# Patient Record
Sex: Female | Born: 1975 | Race: Black or African American | Hispanic: No | Marital: Single | Smoking: Never smoker
Health system: Southern US, Community
[De-identification: ages and names within clinical notes are randomized; demographics above are authoritative.]

## PROBLEM LIST (undated history)

## (undated) DIAGNOSIS — G43909 Migraine, unspecified, not intractable, without status migrainosus: Secondary | ICD-10-CM

## (undated) DIAGNOSIS — N809 Endometriosis, unspecified: Secondary | ICD-10-CM

## (undated) DIAGNOSIS — I10 Essential (primary) hypertension: Secondary | ICD-10-CM

## (undated) DIAGNOSIS — E119 Type 2 diabetes mellitus without complications: Secondary | ICD-10-CM

## (undated) DIAGNOSIS — J45909 Unspecified asthma, uncomplicated: Secondary | ICD-10-CM

## (undated) HISTORY — PX: CHOLECYSTECTOMY: SHX55

## (undated) HISTORY — PX: GASTRIC BYPASS: SHX52

## (undated) HISTORY — PX: APPENDECTOMY: SHX54

## (undated) HISTORY — PX: TONSILLECTOMY: SUR1361

---

## 2017-01-20 ENCOUNTER — Emergency Department (HOSPITAL_BASED_OUTPATIENT_CLINIC_OR_DEPARTMENT_OTHER)
Admission: EM | Admit: 2017-01-20 | Discharge: 2017-01-20 | Disposition: A | Discharge status: Home / Self Care | Payer: BLUE CROSS/BLUE SHIELD | Attending: Emergency Medicine | Admitting: Emergency Medicine

## 2017-01-20 ENCOUNTER — Encounter (HOSPITAL_BASED_OUTPATIENT_CLINIC_OR_DEPARTMENT_OTHER): Payer: Self-pay | Admitting: *Deleted

## 2017-01-20 DIAGNOSIS — B9789 Other viral agents as the cause of diseases classified elsewhere: Secondary | ICD-10-CM

## 2017-01-20 DIAGNOSIS — J45909 Unspecified asthma, uncomplicated: Secondary | ICD-10-CM | POA: Diagnosis not present

## 2017-01-20 DIAGNOSIS — J069 Acute upper respiratory infection, unspecified: Secondary | ICD-10-CM | POA: Diagnosis not present

## 2017-01-20 DIAGNOSIS — R05 Cough: Secondary | ICD-10-CM | POA: Diagnosis present

## 2017-01-20 DIAGNOSIS — I10 Essential (primary) hypertension: Secondary | ICD-10-CM | POA: Insufficient documentation

## 2017-01-20 DIAGNOSIS — Z79899 Other long term (current) drug therapy: Secondary | ICD-10-CM | POA: Insufficient documentation

## 2017-01-20 DIAGNOSIS — F17210 Nicotine dependence, cigarettes, uncomplicated: Secondary | ICD-10-CM | POA: Insufficient documentation

## 2017-01-20 DIAGNOSIS — E119 Type 2 diabetes mellitus without complications: Secondary | ICD-10-CM | POA: Insufficient documentation

## 2017-01-20 HISTORY — DX: Essential (primary) hypertension: I10

## 2017-01-20 HISTORY — DX: Endometriosis, unspecified: N80.9

## 2017-01-20 HISTORY — DX: Migraine, unspecified, not intractable, without status migrainosus: G43.909

## 2017-01-20 HISTORY — DX: Type 2 diabetes mellitus without complications: E11.9

## 2017-01-20 HISTORY — DX: Unspecified asthma, uncomplicated: J45.909

## 2017-01-20 MED ORDER — NAPROXEN 250 MG PO TABS
500.0000 mg | ORAL_TABLET | Freq: Once | ORAL | Status: AC
Start: 2017-01-20 — End: 2017-01-20
  Administered 2017-01-20: 500 mg via ORAL
  Filled 2017-01-20: qty 2

## 2017-01-20 MED ORDER — ALBUTEROL SULFATE HFA 108 (90 BASE) MCG/ACT IN AERS
2.0000 | INHALATION_SPRAY | RESPIRATORY_TRACT | Status: DC | PRN
  Administered 2017-01-20: 2 via RESPIRATORY_TRACT
  Filled 2017-01-20: qty 6.7

## 2017-01-20 NOTE — ED Provider Notes (Signed)
[facility] Provider Note: Yvonne DellJ. Lane Maleya Leever, MD, FACEP  CSN: [id number] MRN: [id number] ARRIVAL: 01/20/17 at 0210 ROOM: [facility]01/[facility]01   CHIEF COMPLAINT  Cough   HISTORY OF PRESENT ILLNESS  Yvonne Burns is a 41 y.o. female with UR's eye symptoms the past 3 days. Specifically she has had nasal congestion, scratchy throat, body aches (which she rates as a 10 out of 10), cough productive of yellow sputum, and chest wall soreness with cough. She has not been wheezing. She denies fever, nausea, vomiting or diarrhea. She has been taking over-the-counter antihistamines and TheraFlu with partial relief.   Past Medical History:  Diagnosis Date  . Asthma   . Diabetes mellitus without complication (HCC)   . Endometriosis   . Hypertension   . Migraine     Past Surgical History:  Procedure Laterality Date  . APPENDECTOMY    . CHOLECYSTECTOMY    . GASTRIC BYPASS    . TONSILLECTOMY      No family history on file.  Social History  Substance Use Topics  . Smoking status: Smoker, Current Status Unknown    Types: Cigarettes  . Smokeless tobacco: Never Used  . Alcohol use Yes    Prior to Admission medications   Medication Sig Start Date End Date Taking? Authorizing Provider  amLODipine (NORVASC) 10 MG tablet Take 10 mg by mouth daily.   Yes Historical Provider, MD  cyclobenzaprine (FLEXERIL) 10 MG tablet Take 10 mg by mouth 2 (two) times daily.   Yes Historical Provider, MD  lisinopril (PRINIVIL,ZESTRIL) 20 MG tablet Take 20 mg by mouth daily.   Yes Historical Provider, MD  omeprazole (PRILOSEC) 20 MG capsule Take 20 mg by mouth daily.   Yes Historical Provider, MD  sertraline (ZOLOFT) 100 MG tablet Take 100 mg by mouth daily.   Yes Historical Provider, MD    Allergies Sulfur   REVIEW OF SYSTEMS  Negative except as noted here or in the History of Present Illness.   PHYSICAL EXAMINATION  Initial Vital Signs Blood pressure (!) 161/112, pulse 89, temperature 98.3 F  (36.8 C), temperature source Oral, resp. rate 18, height 5\' 2"  (1.575 m), weight 179 lb (81.2 kg), SpO2 98 %.  Examination General: Well-developed, well-nourished female in no acute distress; appearance consistent with age of record HENT: normocephalic; atraumatic; no nasal congestion; no pharyngeal erythema or exudate Eyes: pupils equal, round and reactive to light; extraocular muscles intact Neck: supple Heart: regular rate and rhythm Lungs: clear to auscultation bilaterally; persistent dry cough Abdomen: soft; nondistended; nontender; bowel sounds present Extremities: No deformity; full range of motion; pulses normal Neurologic: Awake, alert and oriented; motor function intact in all extremities and symmetric; no facial droop Skin: Warm and dry Psychiatric: Normal mood and affect   RESULTS  Summary of this visit's results, reviewed by myself:   EKG Interpretation  Date/Time:    Ventricular Rate:    PR Interval:    QRS Duration:   QT Interval:    QTC Calculation:   R Axis:     Text Interpretation:        Laboratory Studies: No results found for this or any previous visit (from the past 24 hour(s)). Imaging Studies: No results found.  ED COURSE  Nursing notes and initial vitals signs, including pulse oximetry, reviewed.  Vitals:   01/20/17 0219 01/20/17 0221  BP: (!) 161/112   Pulse: 89   Resp: 18   Temp: 98.3 F (36.8 C)   TempSrc: Oral   SpO2:  98%   Weight:  179 lb (81.2 kg)  Height:  5\' 2"  (1.575 m)    PROCEDURES    ED DIAGNOSES     ICD-9-CM ICD-10-CM   1. Viral URI with cough 465.9 J06.9     B97.89        Paula Libra, MD 01/20/17 [phone]

## 2017-01-20 NOTE — ED Triage Notes (Signed)
c/o cough, congestion, runny nose, body aches onset monday

## 2017-02-05 ENCOUNTER — Emergency Department (HOSPITAL_BASED_OUTPATIENT_CLINIC_OR_DEPARTMENT_OTHER): Payer: BLUE CROSS/BLUE SHIELD

## 2017-02-05 ENCOUNTER — Encounter (HOSPITAL_BASED_OUTPATIENT_CLINIC_OR_DEPARTMENT_OTHER): Payer: Self-pay | Admitting: Emergency Medicine

## 2017-02-05 DIAGNOSIS — I10 Essential (primary) hypertension: Secondary | ICD-10-CM | POA: Insufficient documentation

## 2017-02-05 DIAGNOSIS — Z7984 Long term (current) use of oral hypoglycemic drugs: Secondary | ICD-10-CM | POA: Diagnosis not present

## 2017-02-05 DIAGNOSIS — J45909 Unspecified asthma, uncomplicated: Secondary | ICD-10-CM | POA: Insufficient documentation

## 2017-02-05 DIAGNOSIS — R51 Headache: Secondary | ICD-10-CM | POA: Diagnosis present

## 2017-02-05 DIAGNOSIS — Y929 Unspecified place or not applicable: Secondary | ICD-10-CM | POA: Insufficient documentation

## 2017-02-05 DIAGNOSIS — X509XXA Other and unspecified overexertion or strenuous movements or postures, initial encounter: Secondary | ICD-10-CM | POA: Diagnosis not present

## 2017-02-05 DIAGNOSIS — G43709 Chronic migraine without aura, not intractable, without status migrainosus: Secondary | ICD-10-CM | POA: Diagnosis not present

## 2017-02-05 DIAGNOSIS — M25512 Pain in left shoulder: Secondary | ICD-10-CM | POA: Insufficient documentation

## 2017-02-05 DIAGNOSIS — Y999 Unspecified external cause status: Secondary | ICD-10-CM | POA: Diagnosis not present

## 2017-02-05 DIAGNOSIS — Y939 Activity, unspecified: Secondary | ICD-10-CM | POA: Insufficient documentation

## 2017-02-05 DIAGNOSIS — E119 Type 2 diabetes mellitus without complications: Secondary | ICD-10-CM | POA: Insufficient documentation

## 2017-02-05 DIAGNOSIS — F17210 Nicotine dependence, cigarettes, uncomplicated: Secondary | ICD-10-CM | POA: Insufficient documentation

## 2017-02-05 NOTE — ED Triage Notes (Signed)
Patient states that she has been changing her shifts recently and it has caused her to have recurring Migraines. Patient denies any N?v  - patient is text ing on her phone during triage without grimacing or noted worsening of Headache. Patient reports that light makes her headache worse. Patient also reports that her and her friend were "messing" around the other day and since she has had left shoulder pain with movement

## 2017-02-06 ENCOUNTER — Emergency Department (HOSPITAL_BASED_OUTPATIENT_CLINIC_OR_DEPARTMENT_OTHER)
Admission: EM | Admit: 2017-02-06 | Discharge: 2017-02-06 | Disposition: A | Discharge status: Home / Self Care | Payer: BLUE CROSS/BLUE SHIELD | Attending: Emergency Medicine | Admitting: Emergency Medicine

## 2017-02-06 DIAGNOSIS — G43009 Migraine without aura, not intractable, without status migrainosus: Secondary | ICD-10-CM

## 2017-02-06 DIAGNOSIS — M25512 Pain in left shoulder: Secondary | ICD-10-CM

## 2017-02-06 MED ORDER — IBUPROFEN 800 MG PO TABS: 800.0000 mg | ORAL_TABLET | Freq: Three times a day (TID) | ORAL | 0 refills | Status: DC | PRN

## 2017-02-06 MED ORDER — METOCLOPRAMIDE HCL 5 MG/ML IJ SOLN
10.0000 mg | Freq: Once | INTRAMUSCULAR | Status: AC
  Administered 2017-02-06: 10 mg via INTRAVENOUS
  Filled 2017-02-06: qty 2

## 2017-02-06 MED ORDER — HYDROCODONE-ACETAMINOPHEN 5-325 MG PO TABS: 1.0000 | ORAL_TABLET | Freq: Four times a day (QID) | ORAL | 0 refills | Status: AC | PRN

## 2017-02-06 MED ORDER — KETOROLAC TROMETHAMINE 30 MG/ML IJ SOLN
30.0000 mg | Freq: Once | INTRAMUSCULAR | Status: AC
  Administered 2017-02-06: 30 mg via INTRAVENOUS
  Filled 2017-02-06: qty 1

## 2017-02-06 MED ORDER — SODIUM CHLORIDE 0.9 % IV BOLUS (SEPSIS)
1000.0000 mL | Freq: Once | INTRAVENOUS | Status: AC
  Administered 2017-02-06: 1000 mL via INTRAVENOUS

## 2017-02-06 MED ORDER — DIPHENHYDRAMINE HCL 50 MG/ML IJ SOLN
25.0000 mg | Freq: Once | INTRAMUSCULAR | Status: AC
  Administered 2017-02-06: 25 mg via INTRAVENOUS
  Filled 2017-02-06: qty 1

## 2017-02-06 NOTE — ED Provider Notes (Signed)
TIME SEEN: 1:04 AM  CHIEF COMPLAINT: Migraine headache, left shoulder pain  HPI: Patient is a 41 year old female with history of hypertension, diabetes, asthma, migraine headaches who presents to the emergency department with 2 complaints. She reports that she has had a diffuse throbbing headache that started at 5 PM yesterday consistent with her migraine headaches. Has associated photophobia. No nausea or vomiting. No head injury. Not on blood thinners. No numbness, tingling or focal weakness. States she has had to come to the hospital before she gets a "pain shot" but cannot remember the name of this medication.  Patient also complaining of left shoulder pain. Reports she was "horsing around" the other day and is now having pain with lifting the shoulder above the head. She is left-hand dominant. No numbness or tingling in his arm. No neck or back pain. No head injury.  ROS: See HPI Constitutional: no fever  Eyes: no drainage  ENT: no runny nose   Cardiovascular:  no chest pain  Resp: no SOB  GI: no vomiting GU: no dysuria Integumentary: no rash  Allergy: no hives  Musculoskeletal: no leg swelling  Neurological: no slurred speech ROS otherwise negative  PAST MEDICAL HISTORY/PAST SURGICAL HISTORY:  Past Medical History:  Diagnosis Date  . Asthma   . Diabetes mellitus without complication (HCC)   . Endometriosis   . Hypertension   . Migraine     MEDICATIONS:  Prior to Admission medications   Medication Sig Start Date End Date Taking? Authorizing Provider  amLODipine (NORVASC) 10 MG tablet Take 10 mg by mouth daily.    [provider]  cyclobenzaprine (FLEXERIL) 10 MG tablet Take 10 mg by mouth 2 (two) times daily.    [provider]  lisinopril (PRINIVIL,ZESTRIL) 20 MG tablet Take 20 mg by mouth daily.    [provider]  omeprazole (PRILOSEC) 20 MG capsule Take 20 mg by mouth daily.    [provider]  sertraline (ZOLOFT) 100 MG tablet Take  100 mg by mouth daily.    [provider]    ALLERGIES:  Allergies  Allergen Reactions  . Sulfur     SOCIAL HISTORY:  Social History  Substance Use Topics  . Smoking status: Smoker, Current Status Unknown    Types: Cigarettes  . Smokeless tobacco: Never Used  . Alcohol use Yes    FAMILY HISTORY: History reviewed. No pertinent family history.  EXAM: BP (!) 143/92 (BP Location: Right Arm)   Pulse 88   Temp 98.7 F (37.1 C) (Oral)   Resp 19   Ht 5\' 2"  (1.575 m)   Wt 278 lb (126.1 kg)   LMP 02/01/2017   SpO2 98%   BMI 50.85 kg/m  CONSTITUTIONAL: Alert and oriented and responds appropriately to questions. Well-appearing; well-nourished HEAD: Normocephalic, Atraumatic EYES: Conjunctivae clear, pupils appear equal, EOMI, positive photophobia ENT: normal nose; moist mucous membranes NECK: Supple, no meningismus, no nuchal rigidity, no LAD, no midline spinal tenderness or step-off or deformity  CARD: RRR; S1 and S2 appreciated; no murmurs, no clicks, no rubs, no gallops RESP: Normal chest excursion without splinting or tachypnea; breath sounds clear and equal bilaterally; no wheezes, no rhonchi, no rales, no hypoxia or respiratory distress, speaking full sentences ABD/GI: Normal bowel sounds; non-distended; soft, non-tender, no rebound, no guarding, no peritoneal signs, no hepatosplenomegaly BACK:  The back appears normal and is non-tender to palpation, there is no CVA tenderness EXT: Patient is tender to palpation throughout the left shoulder without loss of fullness  of this joint. Unable to lift the arm above her head without pain. 2+ radial pulses bilaterally. Compartments are all soft. No bony deformity on exam. Otherwise Normal ROM in all joints; otherwise extremities are non-tender to palpation; no edema; normal capillary refill; no cyanosis, no calf tenderness or swelling    SKIN: Normal color for age and race; warm; no rash NEURO: Moves all extremities equally,  sensation to light touch intact diffusely, normal speech, cranial nerves II through XII intact, normal gait PSYCH: The patient's mood and manner are appropriate. Grooming and personal hygiene are appropriate.  MEDICAL DECISION MAKING: Patient here with her typical migraine headache. Will treat with migraine cocktail including portal, Reglan, Benadryl and IV fluids. We'll hold on Decadron given she is diabetic. I do not think she has meningitis, intracranial hemorrhage or stroke. I do not feel she needs emergent head imaging.  She also complaining of left shoulder pain. Suspect strain/sprain versus tendinitis. I feel the Toradol will help with her pain. X-ray obtained in triage shows no acute abnormality. No sign of septic arthritis on exam. Compartments are soft. Neurovascularly intact distally. No obvious sign of trauma on exam. No neck pain.  ED PROGRESS: Patient reports headache is almost completely resolved after migraine cocktail and shorter pain has improved as well. Will get outpatient orthopedic follow-up information and discharge with brief prescription of pain medication to take as needed. Discussed return precautions. Patient's family member at bedside will drive her home.   At this time, I do not feel there is any life-threatening condition present. I have reviewed and discussed all results (EKG, imaging, lab, urine as appropriate) and exam findings with patient/family. I have reviewed nursing notes and appropriate previous records.  I feel the patient is safe to be discharged home without further emergent workup and can continue workup as an outpatient as needed. Discussed usual and customary return precautions. Patient/family verbalize understanding and are comfortable with this plan.  Outpatient follow-up has been provided if needed. All questions have been answered.      Abbigail Anstey, Layla MawKristen N, DO 02/06/17 [phone]

## 2017-02-14 ENCOUNTER — Emergency Department (HOSPITAL_BASED_OUTPATIENT_CLINIC_OR_DEPARTMENT_OTHER)
Admission: EM | Admit: 2017-02-14 | Discharge: 2017-02-15 | Disposition: A | Discharge status: Home / Self Care | Payer: BLUE CROSS/BLUE SHIELD | Attending: Emergency Medicine | Admitting: Emergency Medicine

## 2017-02-14 ENCOUNTER — Encounter (HOSPITAL_BASED_OUTPATIENT_CLINIC_OR_DEPARTMENT_OTHER): Payer: Self-pay

## 2017-02-14 DIAGNOSIS — M25512 Pain in left shoulder: Secondary | ICD-10-CM | POA: Diagnosis not present

## 2017-02-14 DIAGNOSIS — R51 Headache: Secondary | ICD-10-CM | POA: Insufficient documentation

## 2017-02-14 DIAGNOSIS — H53149 Visual discomfort, unspecified: Secondary | ICD-10-CM | POA: Diagnosis not present

## 2017-02-14 DIAGNOSIS — E119 Type 2 diabetes mellitus without complications: Secondary | ICD-10-CM | POA: Diagnosis not present

## 2017-02-14 DIAGNOSIS — I10 Essential (primary) hypertension: Secondary | ICD-10-CM | POA: Insufficient documentation

## 2017-02-14 DIAGNOSIS — R519 Headache, unspecified: Secondary | ICD-10-CM

## 2017-02-14 DIAGNOSIS — Z79899 Other long term (current) drug therapy: Secondary | ICD-10-CM | POA: Diagnosis not present

## 2017-02-14 DIAGNOSIS — J45909 Unspecified asthma, uncomplicated: Secondary | ICD-10-CM | POA: Diagnosis not present

## 2017-02-14 MED ORDER — DIPHENHYDRAMINE HCL 50 MG/ML IJ SOLN
25.0000 mg | Freq: Once | INTRAMUSCULAR | Status: AC
  Administered 2017-02-14: 25 mg via INTRAVENOUS
  Filled 2017-02-14: qty 1

## 2017-02-14 MED ORDER — METOCLOPRAMIDE HCL 5 MG/ML IJ SOLN
10.0000 mg | Freq: Once | INTRAMUSCULAR | Status: AC
  Administered 2017-02-14: 10 mg via INTRAVENOUS
  Filled 2017-02-14: qty 2

## 2017-02-14 MED ORDER — KETOROLAC TROMETHAMINE 30 MG/ML IJ SOLN
30.0000 mg | Freq: Once | INTRAMUSCULAR | Status: AC
  Administered 2017-02-14: 30 mg via INTRAVENOUS
  Filled 2017-02-14: qty 1

## 2017-02-14 MED ORDER — SODIUM CHLORIDE 0.9 % IV BOLUS (SEPSIS)
1000.0000 mL | Freq: Once | INTRAVENOUS | Status: AC
  Administered 2017-02-14: 1000 mL via INTRAVENOUS

## 2017-02-14 NOTE — ED Triage Notes (Signed)
C/o migraine x today-also c/o pain to left shoulder x 1 week-NAD-steady gait

## 2017-02-14 NOTE — ED Notes (Signed)
Pt reports migraine, pain around left side of head and behind left eye that began at 7pm. States history of migraines, but did not attempt to take any meds PTA to relieve pain. Associated light sensitivity. Denies vomiting or diarrhea. Pt also c/o ongoing left shoulder pain x2 weeks, states seen and evaluated for this, had a negative xray and told it was a sprain.

## 2017-02-14 NOTE — ED Provider Notes (Signed)
[facility]   CSN: [id number] Arrival date & time: 02/14/17  2115  By signing my name below, I, Ny'Kea Lewis, attest that this documentation has been prepared under the direction and in the presence of Audry Pili, PA-C. Electronically Signed: Karren Cobble, ED Scribe. 02/14/17. 11:49 PM.  History   Chief Complaint Chief Complaint  Patient presents with  . Migraine    Migraine  Associated symptoms include headaches.   HPI Comments: Yvonne Burns is a 41 y.o. female with a PMHx of HTN, DM, asthma, and migraine headaches who presents to the Emergency Department complaining of gradual onset of a migraine at 7 pm tonight consistent with her migraine headaches and ongoing left shoulder pain. Rates pain 8/10. Aching sensation. She notes associated left-sided eye pain, phonophobia, photophobia, and blurred vision. No fevers. Pt was seen on 5/20 with the same complaints.   Patient also complaining of persistent left shoulder pain that started two weeks ago after she was splaying around with her daughter. During her visit on 5/20 she was told that her Xray was unremarkable and she possibly had a sprain. Pt has not since followed up with orthopedics. Denies numbness,or tingling in his arm. No neck or back pain. No head injury.     Past Medical History:  Diagnosis Date  . Asthma   . Diabetes mellitus without complication (HCC)   . Endometriosis   . Hypertension   . Migraine     There are no active problems to display for this patient.  Past Surgical History:  Procedure Laterality Date  . APPENDECTOMY    . CHOLECYSTECTOMY    . GASTRIC BYPASS    . TONSILLECTOMY     OB History    No data available      Home Medications    Prior to Admission medications   Medication Sig Start Date End Date Taking? Authorizing Provider  amLODipine (NORVASC) 10 MG tablet Take 10 mg by mouth daily.    [provider]  cyclobenzaprine (FLEXERIL) 10 MG tablet Take 10  mg by mouth 2 (two) times daily.    [provider]  HYDROcodone-acetaminophen (NORCO/VICODIN) 5-325 MG tablet Take 1-2 tablets by mouth every 6 (six) hours as needed. 02/06/17   Ward, Layla Maw, DO  ibuprofen (ADVIL,MOTRIN) 800 MG tablet Take 1 tablet (800 mg total) by mouth every 8 (eight) hours as needed for mild pain. 02/06/17   Ward, Layla Maw, DO  lisinopril (PRINIVIL,ZESTRIL) 20 MG tablet Take 20 mg by mouth daily.    [provider]  omeprazole (PRILOSEC) 20 MG capsule Take 20 mg by mouth daily.    [provider]  sertraline (ZOLOFT) 100 MG tablet Take 100 mg by mouth daily.    [provider]    Family History No family history on file.  Social History Social History  Substance Use Topics  . Smoking status: Never Smoker  . Smokeless tobacco: Never Used  . Alcohol use Yes     Comment: occ     Allergies   Sulfur   Review of Systems Review of Systems  HENT:       Notes phonophobia.   Eyes: Positive for photophobia, pain and visual disturbance.  Musculoskeletal: Positive for arthralgias.  Neurological: Positive for headaches. Negative for numbness.       Denies tingling.    A complete 10 system review of systems was obtained and all systems are negative except as noted in the HPI and PMH.  Physical Exam Updated Vital Signs BP 123/70 (BP Location: Right Arm)   Pulse 96   Temp 99 F (37.2 C) (Oral)   Resp 17   Ht 5\' 2"  (1.575 m)   Wt 281 lb (127.5 kg)   LMP 02/01/2017   SpO2 97%   BMI 51.40 kg/m   Physical Exam  Constitutional: She is oriented to person, place, and time. Vital signs are normal. She appears well-developed and well-nourished.  HENT:  Head: Normocephalic and atraumatic.  Right Ear: Hearing normal.  Left Ear: Hearing normal.  Eyes: Conjunctivae and EOM are normal. Pupils are equal, round, and reactive to light.  Neck: Normal range of motion. Neck supple.  Cardiovascular: Normal rate, regular rhythm, normal  heart sounds and intact distal pulses.   Pulmonary/Chest: Effort normal and breath sounds normal.  Abdominal: Soft.  Musculoskeletal: Normal range of motion.  Left Shoulder: Decrease ROM due to pain. NVI. Distal pulses appreciated. Pain with internal/external rotation. No swelling. No erythema.   Neurological: She is alert and oriented to person, place, and time. She has normal strength. No cranial nerve deficit or sensory deficit.  Cranial Nerves:  II: Pupils equal, round, reactive to light III,IV, VI: ptosis not present, extra-ocular motions intact bilaterally  V,VII: smile symmetric, facial light touch sensation equal VIII: hearing grossly normal bilaterally  IX,X: midline uvula rise  XI: bilateral shoulder shrug equal and strong XII: midline tongue extension  Skin: Skin is warm and dry.  Psychiatric: She has a normal mood and affect. Her speech is normal and behavior is normal. Thought content normal.  Nursing note and vitals reviewed.   ED Treatments / Results  DIAGNOSTIC STUDIES: Oxygen Saturation is 97% on RA, normal by my interpretation.   COORDINATION OF CARE: 11:00 PM-Discussed next steps with pt. Pt verbalized understanding and is agreeable with the plan.   Labs (all labs ordered are listed, but only abnormal results are displayed) Labs Reviewed - No data to display  EKG  EKG Interpretation None      Radiology No results found.  Procedures Procedures (including critical care time)  Medications Ordered in ED Medications  oxyCODONE-acetaminophen (PERCOCET/ROXICET) 5-325 MG per tablet 1 tablet (not administered)  metoCLOPramide (REGLAN) injection 10 mg (10 mg Intravenous Given 02/14/17 2319)  diphenhydrAMINE (BENADRYL) injection 25 mg (25 mg Intravenous Given 02/14/17 2319)  ketorolac (TORADOL) 30 MG/ML injection 30 mg (30 mg Intravenous Given 02/14/17 2319)  sodium chloride 0.9 % bolus 1,000 mL (1,000 mLs Intravenous New Bag/Given 02/14/17 2318)   Initial  Impression / Assessment and Plan / ED Course  I have reviewed the triage vital signs and the nursing notes.  Pertinent labs & imaging results that were available during my care of the patient were reviewed by me and considered in my medical decision making (see chart for details).  Final Clinical Impressions(s) / ED Diagnoses  I have reviewed and evaluated the relevant imaging studies.  I have reviewed the relevant previous healthcare records. I obtained HPI from historian. \ ED Course:  Assessment: Patient is a 41 y.o. female that presents with headache x today. Hx same. No fevers. Seen on 02-06-17 for same. Patient is without high-risk features of headache including: Sudden onset/thunderclap HA, No similar headache in past, Altered mental status, Accompanying seizure, Headache with exertion, Age > 50, History of immunocompromise, Neck or shoulder pain, Fever, Use of anticoagulation, Family history of spontaneous SAH, Concomitant drug use, Toxic exposure.  Patient has a normal complete neurological exam, normal vital signs, normal  level of consciousness, no signs of meningismus, is well-appearing/non-toxic appearing, no signs of trauma. No papilledema, no pain over the temporal arteries. Imaging with CT/MRI not indicated given history and physical exam findings. No dangerous or life-threatening conditions suspected or identified by history, physical exam, and by work-up. No indications for hospitalization identified. Pt also with left shoulder pain. Noted previous on ED visit on 02-06-17. Unremarkable imaging. Pt has yet to follow up with Orthopedics. No new trauma. Headache resolved with migraine cocktail. At time of discharge, Patient is in no acute distress. Vital Signs are stable. Patient is able to ambulate. Patient able to tolerate PO.   Disposition/Plan:  DC Home Additional Verbal discharge instructions given and discussed with patient.  Pt Instructed to f/u with PCP in the next week for  evaluation and treatment of symptoms. Return precautions given Pt acknowledges and agrees with plan  Supervising Physician Rolan BuccoBelfi, Melanie, MD   Final diagnoses:  Nonintractable headache, unspecified chronicity pattern, unspecified headache type  Acute pain of left shoulder    New Prescriptions New Prescriptions   No medications on file   I personally performed the services described in this documentation, which was scribed in my presence. The recorded information has been reviewed and is accurate.    Audry PiliMohr, Alia Parsley, PA-C 02/15/17 0019    Rolan BuccoBelfi, Melanie, MD 02/15/17 1504

## 2017-02-15 MED ORDER — IBUPROFEN 600 MG PO TABS: 600.0000 mg | ORAL_TABLET | Freq: Four times a day (QID) | ORAL | 0 refills | Status: DC | PRN

## 2017-02-15 MED ORDER — OXYCODONE-ACETAMINOPHEN 5-325 MG PO TABS
1.0000 | ORAL_TABLET | Freq: Once | ORAL | Status: AC
  Administered 2017-02-15: 1 via ORAL
  Filled 2017-02-15: qty 1

## 2017-02-15 NOTE — Discharge Instructions (Signed)
Please read and follow all provided instructions.  Your diagnoses today include:  1. Nonintractable headache, unspecified chronicity pattern, unspecified headache type   2. Acute pain of left shoulder     Tests performed today include: CT of your head which was normal and did not show any serious cause of your headache Vital signs. See below for your results today.   Medications:  In the Emergency Department you received: Reglan - antinausea/headache medication Benadryl - antihistamine to counteract potential side effects of reglan Toradol - NSAID medication similar to ibuprofen  Take any prescribed medications only as directed.  Additional information:  Follow any educational materials contained in this packet.  You are having a headache. No specific cause was found today for your headache. It may have been a migraine or other cause of headache. Stress, anxiety, fatigue, and depression are common triggers for headaches.   Your headache today does not appear to be life-threatening or require hospitalization, but often the exact cause of headaches is not determined in the emergency department. Therefore, follow-up with your doctor is very important to find out what may have caused your headache and whether or not you need any further diagnostic testing or treatment.   Sometimes headaches can appear benign (not harmful), but then more serious symptoms can develop which should prompt an immediate re-evaluation by your doctor or the emergency department.  BE VERY CAREFUL not to take multiple medicines containing Tylenol (also called acetaminophen). Doing so can lead to an overdose which can damage your liver and cause liver failure and possibly death.   Follow-up instructions: Please follow-up with your primary care provider in the next 3 days for further evaluation of your symptoms.   Return instructions:  Please return to the Emergency Department if you experience worsening  symptoms. Return if the medications do not resolve your headache, if it recurs, or if you have multiple episodes of vomiting or cannot keep down fluids. Return if you have a change from the usual headache. RETURN IMMEDIATELY IF you: Develop a sudden, severe headache Develop confusion or become poorly responsive or faint Develop a fever above 100.51F or problem breathing Have a change in speech, vision, swallowing, or understanding Develop new weakness, numbness, tingling, incoordination in your arms or legs Have a seizure Please return if you have any other emergent concerns.  Additional Information:  Your vital signs today were: BP 120/81    Pulse 72    Temp 99 F (37.2 C) (Oral)    Resp 20    Ht 5\' 2"  (1.575 m)    Wt 127.5 kg (281 lb)    LMP 02/01/2017    SpO2 95%    BMI 51.40 kg/m  If your blood pressure (BP) was elevated above 135/85 this visit, please have this repeated by your doctor within one month. --------------

## 2017-06-25 ENCOUNTER — Encounter (HOSPITAL_BASED_OUTPATIENT_CLINIC_OR_DEPARTMENT_OTHER): Payer: Self-pay | Admitting: Emergency Medicine

## 2017-06-25 ENCOUNTER — Emergency Department (HOSPITAL_BASED_OUTPATIENT_CLINIC_OR_DEPARTMENT_OTHER)
Admission: EM | Admit: 2017-06-25 | Discharge: 2017-06-25 | Disposition: A | Discharge status: Home / Self Care | Payer: BLUE CROSS/BLUE SHIELD | Attending: Emergency Medicine | Admitting: Emergency Medicine

## 2017-06-25 DIAGNOSIS — Z202 Contact with and (suspected) exposure to infections with a predominantly sexual mode of transmission: Secondary | ICD-10-CM | POA: Insufficient documentation

## 2017-06-25 DIAGNOSIS — I10 Essential (primary) hypertension: Secondary | ICD-10-CM | POA: Insufficient documentation

## 2017-06-25 DIAGNOSIS — Z79899 Other long term (current) drug therapy: Secondary | ICD-10-CM | POA: Insufficient documentation

## 2017-06-25 DIAGNOSIS — Z112 Encounter for screening for other bacterial diseases: Secondary | ICD-10-CM | POA: Insufficient documentation

## 2017-06-25 DIAGNOSIS — E119 Type 2 diabetes mellitus without complications: Secondary | ICD-10-CM | POA: Insufficient documentation

## 2017-06-25 DIAGNOSIS — J45909 Unspecified asthma, uncomplicated: Secondary | ICD-10-CM | POA: Insufficient documentation

## 2017-06-25 LAB — WET PREP, GENITAL
Clue Cells Wet Prep HPF POC: NONE SEEN
SPERM: NONE SEEN
Trich, Wet Prep: NONE SEEN
Yeast Wet Prep HPF POC: NONE SEEN

## 2017-06-25 LAB — URINALYSIS, MICROSCOPIC (REFLEX): RBC / HPF: NONE SEEN RBC/hpf (ref 0–5)

## 2017-06-25 LAB — URINALYSIS, ROUTINE W REFLEX MICROSCOPIC
BILIRUBIN URINE: NEGATIVE
GLUCOSE, UA: NEGATIVE mg/dL
HGB URINE DIPSTICK: NEGATIVE
Ketones, ur: NEGATIVE mg/dL
LEUKOCYTES UA: NEGATIVE
Nitrite: POSITIVE — AB
PH: 6 (ref 5.0–8.0)
Protein, ur: NEGATIVE mg/dL
Specific Gravity, Urine: >1.03 — ABNORMAL HIGH (ref 1.005–1.030)

## 2017-06-25 MED ORDER — CEFTRIAXONE SODIUM 250 MG IJ SOLR
250.0000 mg | Freq: Once | INTRAMUSCULAR | Status: AC
  Administered 2017-06-25: 250 mg via INTRAMUSCULAR
  Filled 2017-06-25: qty 250

## 2017-06-25 MED ORDER — AZITHROMYCIN 250 MG PO TABS
1000.0000 mg | ORAL_TABLET | Freq: Once | ORAL | Status: AC
  Administered 2017-06-25: 1000 mg via ORAL
  Filled 2017-06-25: qty 4

## 2017-06-25 NOTE — ED Triage Notes (Signed)
"   My girlfriend tested positive for trich and so I need to be tested" Also having left shoulder pain x 1 month

## 2017-06-25 NOTE — ED Provider Notes (Signed)
[facility] Provider Note   CSN: [id number] Arrival date & time: 06/25/17  1201     History   Chief Complaint Chief Complaint  Patient presents with  . Exposure to STD    HPI Yvonne Burns is a 41 y.o. female who presents to the emergency department with a chief complaint of STD exposure. Her female sexual partner was recently diagnosed with Trichomonas. She denies dysuria, frequency, vaginal pain, itching, or discharge, fever, chills, or pelvic pain at this time. No treatment prior to arrival.   She reports that she is currently sexually active with only one female partner at this time. She was seen by her PCP earlier this week and diagnosed with a UTI, but has not started antibiotics at this time.  The history is provided by the patient. No language interpreter was used.    Past Medical History:  Diagnosis Date  . Asthma   . Diabetes mellitus without complication (HCC)   . Endometriosis   . Hypertension   . Migraine     There are no active problems to display for this patient.   Past Surgical History:  Procedure Laterality Date  . APPENDECTOMY    . CHOLECYSTECTOMY    . GASTRIC BYPASS    . TONSILLECTOMY      OB History    No data available       Home Medications    Prior to Admission medications   Medication Sig Start Date End Date Taking? Authorizing Provider  amLODipine (NORVASC) 10 MG tablet Take 10 mg by mouth daily.   Yes [provider]  cyclobenzaprine (FLEXERIL) 10 MG tablet Take 10 mg by mouth 2 (two) times daily.   Yes [provider]  FLUoxetine (PROZAC) 10 MG tablet Take 10 mg by mouth daily.   Yes [provider]  lisinopril (PRINIVIL,ZESTRIL) 20 MG tablet Take 20 mg by mouth daily.   Yes [provider]  omeprazole (PRILOSEC) 20 MG capsule Take 20 mg by mouth daily.   Yes [provider]  sertraline (ZOLOFT) 100 MG tablet Take 100 mg by mouth daily.   Yes [provider]    HYDROcodone-acetaminophen (NORCO/VICODIN) 5-325 MG tablet Take 1-2 tablets by mouth every 6 (six) hours as needed. 02/06/17   Ward, Layla Maw, DO  ibuprofen (ADVIL,MOTRIN) 600 MG tablet Take 1 tablet (600 mg total) by mouth every 6 (six) hours as needed. 02/15/17   Audry Pili, PA-C    Family History No family history on file.  Social History Social History  Substance Use Topics  . Smoking status: Never Smoker  . Smokeless tobacco: Never Used  . Alcohol use Yes     Comment: occ     Allergies   Sulfur   Review of Systems Review of Systems  Constitutional: Negative for chills and fever.  Genitourinary: Negative for dysuria, frequency, genital sores, pelvic pain, urgency, vaginal bleeding, vaginal discharge and vaginal pain.     Physical Exam Updated Vital Signs BP 138/90 (BP Location: Left Arm)   Pulse 64   Temp 98.4 F (36.9 C) (Oral)   Resp 18   Ht  (1.575 m)   Wt (!) 174.2 kg (384 lb)   LMP 06/07/2017   SpO2 98%   BMI 70.23 kg/m   Physical Exam  Constitutional: No distress.  Obese female.  HENT:  Head: Normocephalic.  Eyes: Conjunctivae are normal.  Neck: Neck supple.  Cardiovascular: Normal rate, regular rhythm, normal heart sounds and intact distal pulses.  Exam reveals no gallop and no friction rub.   No murmur heard. Pulmonary/Chest: Effort normal and breath sounds normal. No respiratory distress. She has no wheezes. She has no rales. She exhibits no tenderness.  Abdominal: Soft. Bowel sounds are normal. She exhibits no distension and no mass. There is no tenderness. There is no rebound and no guarding. No hernia.  No CVA tenderness bilaterally.  Genitourinary:  Genitourinary Comments: Chaperoned exam. No cervical motion tenderness or adnexal tenderness. No discharge noted in the vaginal vault.  Neurological: She is alert.  Skin: Skin is warm. No rash noted.  Psychiatric: Her behavior is normal.  Nursing note and vitals reviewed.    ED  Treatments / Results  Labs (all labs ordered are listed, but only abnormal results are displayed) Labs Reviewed  WET PREP, GENITAL - Abnormal; Notable for the following:       Result Value   WBC, Wet Prep HPF POC FEW (*)    All other components within normal limits  URINALYSIS, ROUTINE W REFLEX MICROSCOPIC - Abnormal; Notable for the following:    APPearance CLOUDY (*)    Specific Gravity, Urine >1.030 (*)    Nitrite POSITIVE (*)    All other components within normal limits  URINALYSIS, MICROSCOPIC (REFLEX) - Abnormal; Notable for the following:    Bacteria, UA MANY (*)    Squamous Epithelial / LPF 6-30 (*)    All other components within normal limits  URINE CULTURE  GC/CHLAMYDIA PROBE AMP ([place]) NOT AT [facility]    EKG  EKG Interpretation None       Radiology No results found.  Procedures Procedures (including critical care time)  Medications Ordered in ED Medications  azithromycin (ZITHROMAX) tablet 1,000 mg (not administered)  cefTRIAXone (ROCEPHIN) injection 250 mg (not administered)     Initial Impression / Assessment and Plan / ED Course  I have reviewed the triage vital signs and the nursing notes.  Pertinent labs & imaging results that were available during my care of the patient were reviewed by me and considered in my medical decision making (see chart for details).     Patient to be discharged with instructions to follow up with OBGYN. Discussed importance of using protection when sexually active. Pt understands that they have GC/Chlamydia cultures pending and that they will need to inform all sexual partners if results return positive. Pt has been treated prophylacticly with azithromycin and rocephin due to pts history, pelvic exam, and wet prep with increased WBCs. Pt not concerning for PID because hemodynamically stable and no cervical motion tenderness on pelvic exam.   Final Clinical Impressions(s) / ED Diagnoses   Final diagnoses:  Exposure to  STD    New Prescriptions New Prescriptions   No medications on file     Barkley Boards, PA-C 06/25/17 1357    LongArlyss Repress, MD 06/26/17 [phone]

## 2017-06-25 NOTE — Discharge Instructions (Signed)
Your gonorrhea, chlamydia, and urine culture are pending. If you receive a call that you are positive for gonorrhea or chlamydia, you have already been treated.   Please make sure not to have sex, including oral or anal sex, if your partner is currently being treated for an STI. Please wait until one week after antibiotics are finished.   If you develop new or worsening symptoms, including fever or severe pelvic pain, please return to the emergency department for re-evaluation.

## 2017-06-27 LAB — GC/CHLAMYDIA PROBE AMP (~~LOC~~) NOT AT ARMC
Chlamydia: NEGATIVE
NEISSERIA GONORRHEA: NEGATIVE

## 2017-06-28 LAB — URINE CULTURE

## 2017-06-29 ENCOUNTER — Telehealth: Payer: Self-pay | Admitting: Emergency Medicine

## 2017-06-29 NOTE — Telephone Encounter (Signed)
Post ED Visit - Positive Culture Follow-up  Culture report reviewed by antimicrobial stewardship pharmacist:   Enzo Bi, Pharm.D.  Celedonio Miyamoto, Pharm.D., BCPS AQ-ID  Garvin Fila, Pharm.D., BCPS  Georgina Pillion, Pharm.D., BCPS  [place], [place].D., BCPS, AAHIVP  Estella Husk, Pharm.D., BCPS, AAHIVP  Lysle Pearl, PharmD, BCPS  Casilda Carls, PharmD, BCPS  Pollyann Samples, PharmD, BCPS  Positive urine culture Treated with none, asymptomatic, no further patient follow-up is required at this time.  Berle Mull 06/29/2017, 4:24 PM

## 2017-08-14 ENCOUNTER — Encounter (HOSPITAL_BASED_OUTPATIENT_CLINIC_OR_DEPARTMENT_OTHER): Payer: Self-pay | Admitting: Emergency Medicine

## 2017-08-14 ENCOUNTER — Emergency Department (HOSPITAL_BASED_OUTPATIENT_CLINIC_OR_DEPARTMENT_OTHER)
Admission: EM | Admit: 2017-08-14 | Discharge: 2017-08-14 | Disposition: A | Discharge status: Home / Self Care | Payer: Self-pay | Attending: Emergency Medicine | Admitting: Emergency Medicine

## 2017-08-14 ENCOUNTER — Emergency Department (HOSPITAL_BASED_OUTPATIENT_CLINIC_OR_DEPARTMENT_OTHER): Payer: Self-pay

## 2017-08-14 ENCOUNTER — Other Ambulatory Visit: Payer: Self-pay

## 2017-08-14 DIAGNOSIS — J45909 Unspecified asthma, uncomplicated: Secondary | ICD-10-CM | POA: Insufficient documentation

## 2017-08-14 DIAGNOSIS — I10 Essential (primary) hypertension: Secondary | ICD-10-CM | POA: Insufficient documentation

## 2017-08-14 DIAGNOSIS — J111 Influenza due to unidentified influenza virus with other respiratory manifestations: Secondary | ICD-10-CM

## 2017-08-14 DIAGNOSIS — R69 Illness, unspecified: Secondary | ICD-10-CM

## 2017-08-14 DIAGNOSIS — E119 Type 2 diabetes mellitus without complications: Secondary | ICD-10-CM | POA: Insufficient documentation

## 2017-08-14 DIAGNOSIS — M791 Myalgia, unspecified site: Secondary | ICD-10-CM | POA: Insufficient documentation

## 2017-08-14 DIAGNOSIS — G43809 Other migraine, not intractable, without status migrainosus: Secondary | ICD-10-CM | POA: Insufficient documentation

## 2017-08-14 LAB — RAPID STREP SCREEN (MED CTR MEBANE ONLY): STREPTOCOCCUS, GROUP A SCREEN (DIRECT): NEGATIVE

## 2017-08-14 MED ORDER — METOCLOPRAMIDE HCL 10 MG PO TABS
10.0000 mg | ORAL_TABLET | Freq: Once | ORAL | Status: AC
  Administered 2017-08-14: 10 mg via ORAL
  Filled 2017-08-14: qty 1

## 2017-08-14 MED ORDER — BENZONATATE 100 MG PO CAPS: 100.0000 mg | ORAL_CAPSULE | Freq: Three times a day (TID) | ORAL | 0 refills | Status: AC

## 2017-08-14 MED ORDER — KETOROLAC TROMETHAMINE 30 MG/ML IJ SOLN
30.0000 mg | Freq: Once | INTRAMUSCULAR | Status: AC
  Administered 2017-08-14: 30 mg via INTRAMUSCULAR
  Filled 2017-08-14: qty 1

## 2017-08-14 MED ORDER — NAPROXEN 500 MG PO TABS: 500.0000 mg | ORAL_TABLET | Freq: Two times a day (BID) | ORAL | 0 refills | Status: AC

## 2017-08-14 NOTE — ED Provider Notes (Signed)
Emergency Department Provider Note   I have reviewed the triage vital signs and the nursing notes.   HISTORY  Chief Complaint Generalized Body Aches   HPI Yvonne Burns is a 41 y.o. female with PMH of asthma, DM, and HTN presents to the emergency department for evaluation of sore throat, body aches, headache over the past 2-3 days.  She denies any vomiting or diarrhea.  No pain in the chest.  She reports that she did get the flu shot this year.  She has had multiple sick contacts at work with similar symptoms.  No radiation of symptoms.  No modifying factors.  She also notes headache starting today that is typical of her migraine headaches.  No weakness or numbness.  No vision changes.  She does have light sensitivity.   Past Medical History:  Diagnosis Date  . Asthma   . Diabetes mellitus without complication (HCC)   . Endometriosis   . Hypertension   . Migraine     There are no active problems to display for this patient.   Past Surgical History:  Procedure Laterality Date  . APPENDECTOMY    . CHOLECYSTECTOMY    . GASTRIC BYPASS    . TONSILLECTOMY      Current Outpatient Rx  . Order #: [id number] Class: Historical Med  . Order #: [id number] Class: Print  . Order #: [id number] Class: Historical Med  . Order #: [id number] Class: Historical Med  . Order #: [id number] Class: Print  . Order #: [id number] Class: Print  . Order #: [id number] Class: Historical Med  . Order #: [id number] Class: Print  . Order #: [id number] Class: Historical Med  . Order #: [id number] Class: Historical Med    Allergies Sulfur  History reviewed. No pertinent family history.  Social History Social History   Tobacco Use  . Smoking status: Never Smoker  . Smokeless tobacco: Never Used  Substance Use Topics  . Alcohol use: Yes    Comment: occ  . Drug use: No    Review of Systems  Constitutional: Positive subjective fever/chills. Positive bony aches.  Eyes: No visual changes. ENT: Positive  sore throat. Cardiovascular: Denies chest pain. Respiratory: Denies shortness of breath. Gastrointestinal: No abdominal pain.  No nausea, no vomiting.  No diarrhea.  No constipation. Genitourinary: Negative for dysuria. Musculoskeletal: Negative for back pain. Skin: Negative for rash. Neurological: Negative for focal weakness or numbness. Positive HA.   10-point ROS otherwise negative.  ____________________________________________   PHYSICAL EXAM:  VITAL SIGNS: ED Triage Vitals  Enc Vitals Group     BP 08/14/17 1435 (!) 164/105     Pulse Rate 08/14/17 1435 73     Resp 08/14/17 1435 19     Temp 08/14/17 1435 98.3 F (36.8 C)     Temp Source 08/14/17 1435 Oral     SpO2 08/14/17 1435 100 %     Weight 08/14/17 1435 (!) 347 lb (157.4 kg)     Height 08/14/17 1435 5\' 2"  (1.575 m)   Constitutional: Alert and oriented. Well appearing and in no acute distress. Eyes: Conjunctivae are normal. Head: Atraumatic. Nose: No congestion/rhinnorhea. Mouth/Throat: Mucous membranes are moist.  Oropharynx with mild erythema. No exudate. No PTA.  Neck: No stridor.   Cardiovascular: Normal rate, regular rhythm. Good peripheral circulation. Grossly normal heart sounds.   Respiratory: Normal respiratory effort.  No retractions. Lungs CTAB. Gastrointestinal: Soft and nontender. No distention.  Musculoskeletal: No lower extremity tenderness nor edema. No gross deformities of extremities. Neurologic:  Normal speech and language. No gross focal neurologic  deficits are appreciated.  Skin:  Skin is warm, dry and intact. No rash noted.  ____________________________________________   LABS (all labs ordered are listed, but only abnormal results are displayed)  Labs Reviewed  [facility])  CULTURE, GROUP A STREP [facility]   ____________________________________________  RADIOLOGY  Dg Chest 2 View  Result Date: 08/14/2017 CLINICAL DATA:  Patient with cough, fever and sore throat.  EXAM: CHEST  2 VIEW COMPARISON:  CTA chest 11/11/2010; chest radiograph 05/02/2010 FINDINGS: Stable cardiac and mediastinal contours. No consolidative pulmonary opacities. No pleural effusion or pneumothorax. Thoracic spine degenerative changes. IMPRESSION: No acute cardiopulmonary process. Electronically Signed   By: Annia Beltrew  Davis M.D.   On: 08/14/2017 15:35    ____________________________________________   PROCEDURES  Procedure(s) performed:   Procedures  None ____________________________________________   INITIAL IMPRESSION / ASSESSMENT AND PLAN / ED COURSE  Pertinent labs & imaging results that were available during my care of the patient were reviewed by me and considered in my medical decision making (see chart for details).  Patient presents emergency department for evaluation of a viral symptoms including sore throat, body aches, headache.  There is mild erythema in the posterior pharynx without exudate.  She has shotty lymphadenopathy.  Patient out of window to benefit from Tamiflu.  Plan for chest x-ray to rule out pneumonia.  Sending rapid strep testing and will give Toradol and Reglan for headache symptoms.  03:45 PM Rapid strep negative. CXR with no infiltrate. Plan for discharge home with plan for supportive care.   At this time, I do not feel there is any life-threatening condition present. I have reviewed and discussed all results (EKG, imaging, lab, urine as appropriate), exam findings with patient. I have reviewed nursing notes and appropriate previous records.  I feel the patient is safe to be discharged home without further emergent workup. Discussed usual and customary return precautions. Patient and family (if present) verbalize understanding and are comfortable with this plan.  Patient will follow-up with their primary care provider. If they do not have a primary care provider, information for follow-up has been provided to them. All questions have been  answered.  ____________________________________________  FINAL CLINICAL IMPRESSION(S) / ED DIAGNOSES  Final diagnoses:  Influenza-like illness  Other migraine without status migrainosus, not intractable     MEDICATIONS GIVEN DURING THIS VISIT:  Medications  ketorolac (TORADOL) 30 MG/ML injection 30 mg (30 mg Intramuscular Given 08/14/17 1545)  metoCLOPramide (REGLAN) tablet 10 mg (10 mg Oral Given 08/14/17 1545)    Note:  This document was prepared using Dragon voice recognition software and may include unintentional dictation errors.  Alona BeneJoshua Chapel Silverthorn, MD Emergency Medicine    Oluwafemi Villella, Arlyss RepressJoshua G, MD 08/15/17 0000

## 2017-08-14 NOTE — ED Notes (Signed)
Pt not in room.

## 2017-08-14 NOTE — Discharge Instructions (Signed)

## 2017-08-14 NOTE — ED Triage Notes (Signed)
Patient states that she has had sore throat, body aches and headache for that past 2 -3 days. She is hoarse. The patient is texting on phone in triage and in no noted distress

## 2017-08-17 LAB — CULTURE, GROUP A STREP (THRC)

## 2017-09-17 ENCOUNTER — Other Ambulatory Visit: Payer: Self-pay

## 2017-09-17 ENCOUNTER — Encounter (HOSPITAL_BASED_OUTPATIENT_CLINIC_OR_DEPARTMENT_OTHER): Payer: Self-pay | Admitting: Emergency Medicine

## 2017-09-17 ENCOUNTER — Emergency Department (HOSPITAL_BASED_OUTPATIENT_CLINIC_OR_DEPARTMENT_OTHER)
Admission: EM | Admit: 2017-09-17 | Discharge: 2017-09-17 | Disposition: A | Discharge status: Home / Self Care | Payer: Self-pay | Attending: Emergency Medicine | Admitting: Emergency Medicine

## 2017-09-17 ENCOUNTER — Emergency Department (HOSPITAL_BASED_OUTPATIENT_CLINIC_OR_DEPARTMENT_OTHER): Payer: Self-pay

## 2017-09-17 DIAGNOSIS — G43909 Migraine, unspecified, not intractable, without status migrainosus: Secondary | ICD-10-CM | POA: Insufficient documentation

## 2017-09-17 DIAGNOSIS — M25512 Pain in left shoulder: Secondary | ICD-10-CM | POA: Insufficient documentation

## 2017-09-17 DIAGNOSIS — R0789 Other chest pain: Secondary | ICD-10-CM | POA: Insufficient documentation

## 2017-09-17 DIAGNOSIS — Z79899 Other long term (current) drug therapy: Secondary | ICD-10-CM | POA: Insufficient documentation

## 2017-09-17 DIAGNOSIS — G8929 Other chronic pain: Secondary | ICD-10-CM

## 2017-09-17 DIAGNOSIS — R072 Precordial pain: Secondary | ICD-10-CM

## 2017-09-17 DIAGNOSIS — E119 Type 2 diabetes mellitus without complications: Secondary | ICD-10-CM | POA: Insufficient documentation

## 2017-09-17 DIAGNOSIS — I10 Essential (primary) hypertension: Secondary | ICD-10-CM | POA: Insufficient documentation

## 2017-09-17 DIAGNOSIS — G43809 Other migraine, not intractable, without status migrainosus: Secondary | ICD-10-CM

## 2017-09-17 LAB — BASIC METABOLIC PANEL
ANION GAP: 7 (ref 5–15)
BUN: 16 mg/dL (ref 6–20)
CALCIUM: 9.1 mg/dL (ref 8.9–10.3)
CO2: 27 mmol/L (ref 22–32)
CREATININE: 0.57 mg/dL (ref 0.44–1.00)
Chloride: 105 mmol/L (ref 101–111)
GLUCOSE: 115 mg/dL — AB (ref 65–99)
Potassium: 4 mmol/L (ref 3.5–5.1)
Sodium: 139 mmol/L (ref 135–145)

## 2017-09-17 LAB — CBC
HCT: 41.6 % (ref 36.0–46.0)
HEMOGLOBIN: 13.7 g/dL (ref 12.0–15.0)
MCH: 29.8 pg (ref 26.0–34.0)
MCHC: 32.9 g/dL (ref 30.0–36.0)
MCV: 90.6 fL (ref 78.0–100.0)
PLATELETS: 406 10*3/uL — AB (ref 150–400)
RBC: 4.59 MIL/uL (ref 3.87–5.11)
RDW: 12.3 % (ref 11.5–15.5)
WBC: 8.3 10*3/uL (ref 4.0–10.5)

## 2017-09-17 LAB — TROPONIN I

## 2017-09-17 MED ORDER — KETOROLAC TROMETHAMINE 30 MG/ML IJ SOLN
30.0000 mg | Freq: Once | INTRAMUSCULAR | Status: AC
  Administered 2017-09-17: 30 mg via INTRAVENOUS
  Filled 2017-09-17: qty 1

## 2017-09-17 MED ORDER — METOCLOPRAMIDE HCL 10 MG PO TABS: 10.0000 mg | ORAL_TABLET | Freq: Four times a day (QID) | ORAL | 0 refills | Status: AC

## 2017-09-17 MED ORDER — TRAMADOL HCL 50 MG PO TABS
50.0000 mg | ORAL_TABLET | Freq: Once | ORAL | Status: AC
  Administered 2017-09-17: 50 mg via ORAL
  Filled 2017-09-17: qty 1

## 2017-09-17 MED ORDER — METOCLOPRAMIDE HCL 5 MG/ML IJ SOLN
10.0000 mg | Freq: Once | INTRAMUSCULAR | Status: AC
  Administered 2017-09-17: 10 mg via INTRAVENOUS
  Filled 2017-09-17: qty 2

## 2017-09-17 MED ORDER — METHOCARBAMOL 500 MG PO TABS: 500.0000 mg | ORAL_TABLET | Freq: Two times a day (BID) | ORAL | 0 refills | Status: AC

## 2017-09-17 MED ORDER — DIPHENHYDRAMINE HCL 50 MG/ML IJ SOLN
25.0000 mg | Freq: Once | INTRAMUSCULAR | Status: AC
  Administered 2017-09-17: 25 mg via INTRAVENOUS
  Filled 2017-09-17: qty 1

## 2017-09-17 MED ORDER — IBUPROFEN 800 MG PO TABS: 800.0000 mg | ORAL_TABLET | Freq: Three times a day (TID) | ORAL | 0 refills | Status: AC | PRN

## 2017-09-17 NOTE — ED Notes (Signed)
Patient transported to X-ray 

## 2017-09-17 NOTE — ED Provider Notes (Signed)
[facility] EMERGENCY DEPARTMENT Provider Note   CSN: [id number] Arrival date & time: 09/17/17  1223     History   Chief Complaint Chief Complaint  Patient presents with  . Chest Pain  . Headache  . Numbness    HPI Yvonne Burns is a 41 y.o. female.  HPI 41 yo female with PMH of DM2, HTN, Mirgraine, Asthma who presents with headache, chest pain with radiation to right arm with associated numbness, and more chronic left shoulder pain. She reports that she had a migraine last night; she was on imitrex but her doctor switched her to something else that she cannot recall (possibly atarax) which made her go to sleep. She woke up this morning around 7AM with a migraine headache. Her headache is frontal, throbbing with associated visual disturbance (seeing stars); there are her usual migraine symptoms. She went to sleep again and woke up around 9:15. She was in bed when she suddenly started having shooting pain in her chest with radiation to her right arm; he finger tips felt numb on the right hand. It lasted about 10 minutes. After she felt weak in general. She went to work and was sitting in the car when the symptoms returned. They are intermittent and self resolve. She has no associated nausea, diaphoresis or shortness of breath. She thought she should get evaluated in the ED. She also reports of more chronic left shoulder pain for the past few months that has progressively worsened. No weakness or numbness on the left arm. No neck pain. Of note, she reports that for the past 2-3 weeks she has been working double shifts at her full time job in addition to her second part time job. 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 She reports of family history of early heart disease; her mother passed away from MI at age 43yo. Denies any tobacco use. Is not on contraceptives; has her tubes tide. No new  changes to medications.   Past Medical History:  Diagnosis Date  . Asthma   . Diabetes mellitus without complication (HCC)   . Endometriosis   . Hypertension   . Migraine     There are no active problems to display for this patient.   Past Surgical History:  Procedure Laterality Date  . APPENDECTOMY    . CHOLECYSTECTOMY    . GASTRIC BYPASS    . TONSILLECTOMY      OB History    No data available       Home Medications    Prior to Admission medications   Medication Sig Start Date End Date Taking? Authorizing Provider  amLODipine (NORVASC) 10 MG tablet Take 10 mg by mouth daily.   Yes [provider]  FLUoxetine (PROZAC) 10 MG tablet Take 10 mg by mouth daily.   Yes [provider]  hydrOXYzine (ATARAX/VISTARIL) 10 MG tablet Take 10 mg by mouth 3 (three) times daily as needed.   Yes [provider]  lisinopril (PRINIVIL,ZESTRIL) 20 MG tablet Take 20 mg by mouth daily.   Yes [provider]  omeprazole (PRILOSEC) 20 MG capsule Take 20 mg by mouth daily.   Yes [provider]  sertraline (ZOLOFT) 100 MG tablet Take 100 mg by mouth daily.   Yes [provider]  benzonatate (TESSALON) 100 MG capsule Take 1 capsule (100 mg total) by mouth every 8 (eight) hours. 08/14/17  Long, Arlyss Repress, MD  cyclobenzaprine (FLEXERIL) 10 MG tablet Take 10 mg by mouth 2 (two) times daily.    [provider]  HYDROcodone-acetaminophen (NORCO/VICODIN) 5-325 MG tablet Take 1-2 tablets by mouth every 6 (six) hours as needed. 02/06/17   Ward, Layla Maw, DO  ibuprofen (ADVIL,MOTRIN) 600 MG tablet Take 1 tablet (600 mg total) by mouth every 6 (six) hours as needed. 02/15/17   Audry Pili, PA-C  naproxen (NAPROSYN) 500 MG tablet Take 1 tablet (500 mg total) by mouth 2 (two) times daily. 08/14/17   Long, Arlyss Repress, MD    Family History No family history on file.  Social History Social History   Tobacco Use  . Smoking status: Never Smoker    . Smokeless tobacco: Never Used  Substance Use Topics  . Alcohol use: Yes    Comment: occ  . Drug use: No     Allergies   Sulfur   Review of Systems Review of Systems  Constitutional: Negative for chills, diaphoresis and fever.  HENT: Negative for rhinorrhea and sore throat.   Respiratory: Positive for chest tightness. Negative for cough and shortness of breath.   Cardiovascular: Positive for chest pain. Negative for palpitations and leg swelling.  Gastrointestinal: Negative for abdominal pain.  Neurological: Positive for numbness and headaches. Negative for dizziness, weakness and light-headedness.  Psychiatric/Behavioral: Negative for confusion and decreased concentration.     Physical Exam Updated Vital Signs BP (!) 161/104   Pulse 61   Temp 98.7 F (37.1 C) (Oral)   Resp 20   Ht 5\' 2"  (1.575 m)   Wt (!) 148.3 kg (327 lb)   LMP 07/18/2017   SpO2 98%   BMI 59.81 kg/m   Physical Exam  Constitutional: She is oriented to person, place, and time. She appears well-developed and well-nourished.  Non-toxic appearance. She does not appear ill.  Appears tired   Eyes: EOM are normal. Pupils are equal, round, and reactive to light.  Neck: Normal range of motion. Neck supple.  Cardiovascular: Normal rate, regular rhythm and normal pulses.  Pulmonary/Chest: Effort normal and breath sounds normal. She has no decreased breath sounds. She has no wheezes. She has no rhonchi. She has no rales.  Abdominal: Soft. Bowel sounds are normal. There is no tenderness.  Neurological: She is alert and oriented to person, place, and time. No cranial nerve deficit.  Right shoulder: normal range of motion. Left shoulder: Tenderness to palpation of the anterior shoulder, essentially normal active range of motion but with pain. Strength is normal in upper and lower extremities bilaterally.  Normal sensation to light touch in upper and lower extremities bilaterally.   Skin: Skin is warm and dry.  Capillary refill takes less than 2 seconds.  Psychiatric: She has a normal mood and affect. Her behavior is normal.   ED Treatments / Results  Labs (all labs ordered are listed, but only abnormal results are displayed) Labs Reviewed  BASIC METABOLIC PANEL - Abnormal; Notable for the following components:      Result Value   Glucose, Bld 115 (*)    All other components within normal limits  CBC - Abnormal; Notable for the following components:   Platelets 406 (*)    All other components within normal limits  TROPONIN I  PREGNANCY, URINE  TROPONIN I    EKG  EKG Interpretation  Date/Time:  Saturday September 17 2017 12:32:40 EST Ventricular Rate:  72 PR Interval:  150 QRS Duration: 86 QT Interval:  420  QTC Calculation: 459 R Axis:   47 Text Interpretation:  Normal sinus rhythm Normal ECG No STEMI.  Confirmed by Alona BeneLong, Joshua [phone]([id number]) on 09/17/2017 12:37:42 PM Also confirmed by Alona BeneLong, Joshua [phone]), editor Barbette HairCassel, Kerry [phone]([id number])  on 09/17/2017 12:59:18 PM       Radiology Dg Chest 2 View  Result Date: 09/17/2017 CLINICAL DATA:  Right-sided chest pain EXAM: CHEST  2 VIEW COMPARISON:  08/14/2017 FINDINGS: The heart size and mediastinal contours are within normal limits. Both lungs are clear. The visualized skeletal structures are unremarkable. IMPRESSION: No active cardiopulmonary disease. Electronically Signed   By: Kennith CenterEric  Mansell M.D.   On: 09/17/2017 12:58    Procedures Procedures (including critical care time)  Medications Ordered in ED Medications  diphenhydrAMINE (BENADRYL) injection 25 mg (25 mg Intravenous Given 09/17/17 1345)  ketorolac (TORADOL) 30 MG/ML injection 30 mg (30 mg Intravenous Given 09/17/17 1344)  metoCLOPramide (REGLAN) injection 10 mg (10 mg Intravenous Given 09/17/17 1344)  traMADol (ULTRAM) tablet 50 mg (50 mg Oral Given 09/17/17 1440)     Initial Impression / Assessment and Plan / ED Course  I have reviewed the triage vital signs and the nursing  notes.  Pertinent labs & imaging results that were available during my care of the patient were reviewed by me and considered in my medical decision making (see chart for details).  41 yo female with PMH of DM2, HTN, Mirgraine, Asthma who presents with headache, chest pain with radiation to right arm with associated numbness, and more chronic left shoulder pain. She is afebrile, blood pressure initially elevated but improved with treatment of pain.  Her headache is consistent with her usual migraines. Her neurological exam is unremarkable. Her HA improved with Migraine cocktail of benadryl, reglan, and toradol.  Her chest pain symptoms are atypical and reproducible on palpation of her chest wall. Her EKG is unremarkable. Her Troponin I < 0.03. Will obtain delta troponin at 1453. She does have a family history of early heart disease as her mother had MI at age 43yo. Due to this will refer to outpatient cardiology as long as her delta troponin is negative.  Her left shoulder is likely due to rotator cuff syndrome. Exam is not consistent with a rotator cuff tear. Symptoms did not improve with Toradol. Will give Tramadol x 1. Will need to follow up with sports medicine.   Patient signed out to Attending prior to discharge, pending second troponin  Final Clinical Impressions(s) / ED Diagnoses   Final diagnoses:  Other migraine without status migrainosus, not intractable  Atypical chest pain  Chronic left shoulder pain    ED Discharge Orders    None       Palma HolterGunadasa, Kanishka G, MD 09/17/17 1523    Palma HolterGunadasa, Kanishka G, MD 09/17/17 1600    Maia PlanLong, Joshua G, MD 09/17/17 2055

## 2017-09-17 NOTE — ED Triage Notes (Signed)
Pt c/o center chest pain that radiates to right arm causing numbness to fingers onset about 0930 today. Pt also c/o headache when she woke up at 0700 today. Pt reports muscle tightness to left shoulder that has been ongoing for months.

## 2017-09-17 NOTE — Discharge Instructions (Signed)

## 2017-09-17 NOTE — ED Notes (Addendum)
Pt given d/c instructions as per chart. Rx x 2 with precautions. Verbalizes understanding. No questions. 

## 2018-01-18 DEATH — deceased

## 2018-02-09 IMAGING — DX DG CHEST 2V
2 series · 2 of 2 positions shown · non-contrast
Comparison: 08/14/2017

CLINICAL DATA: Right-sided chest pain

EXAM:
CHEST  2 VIEW

[chest pa]
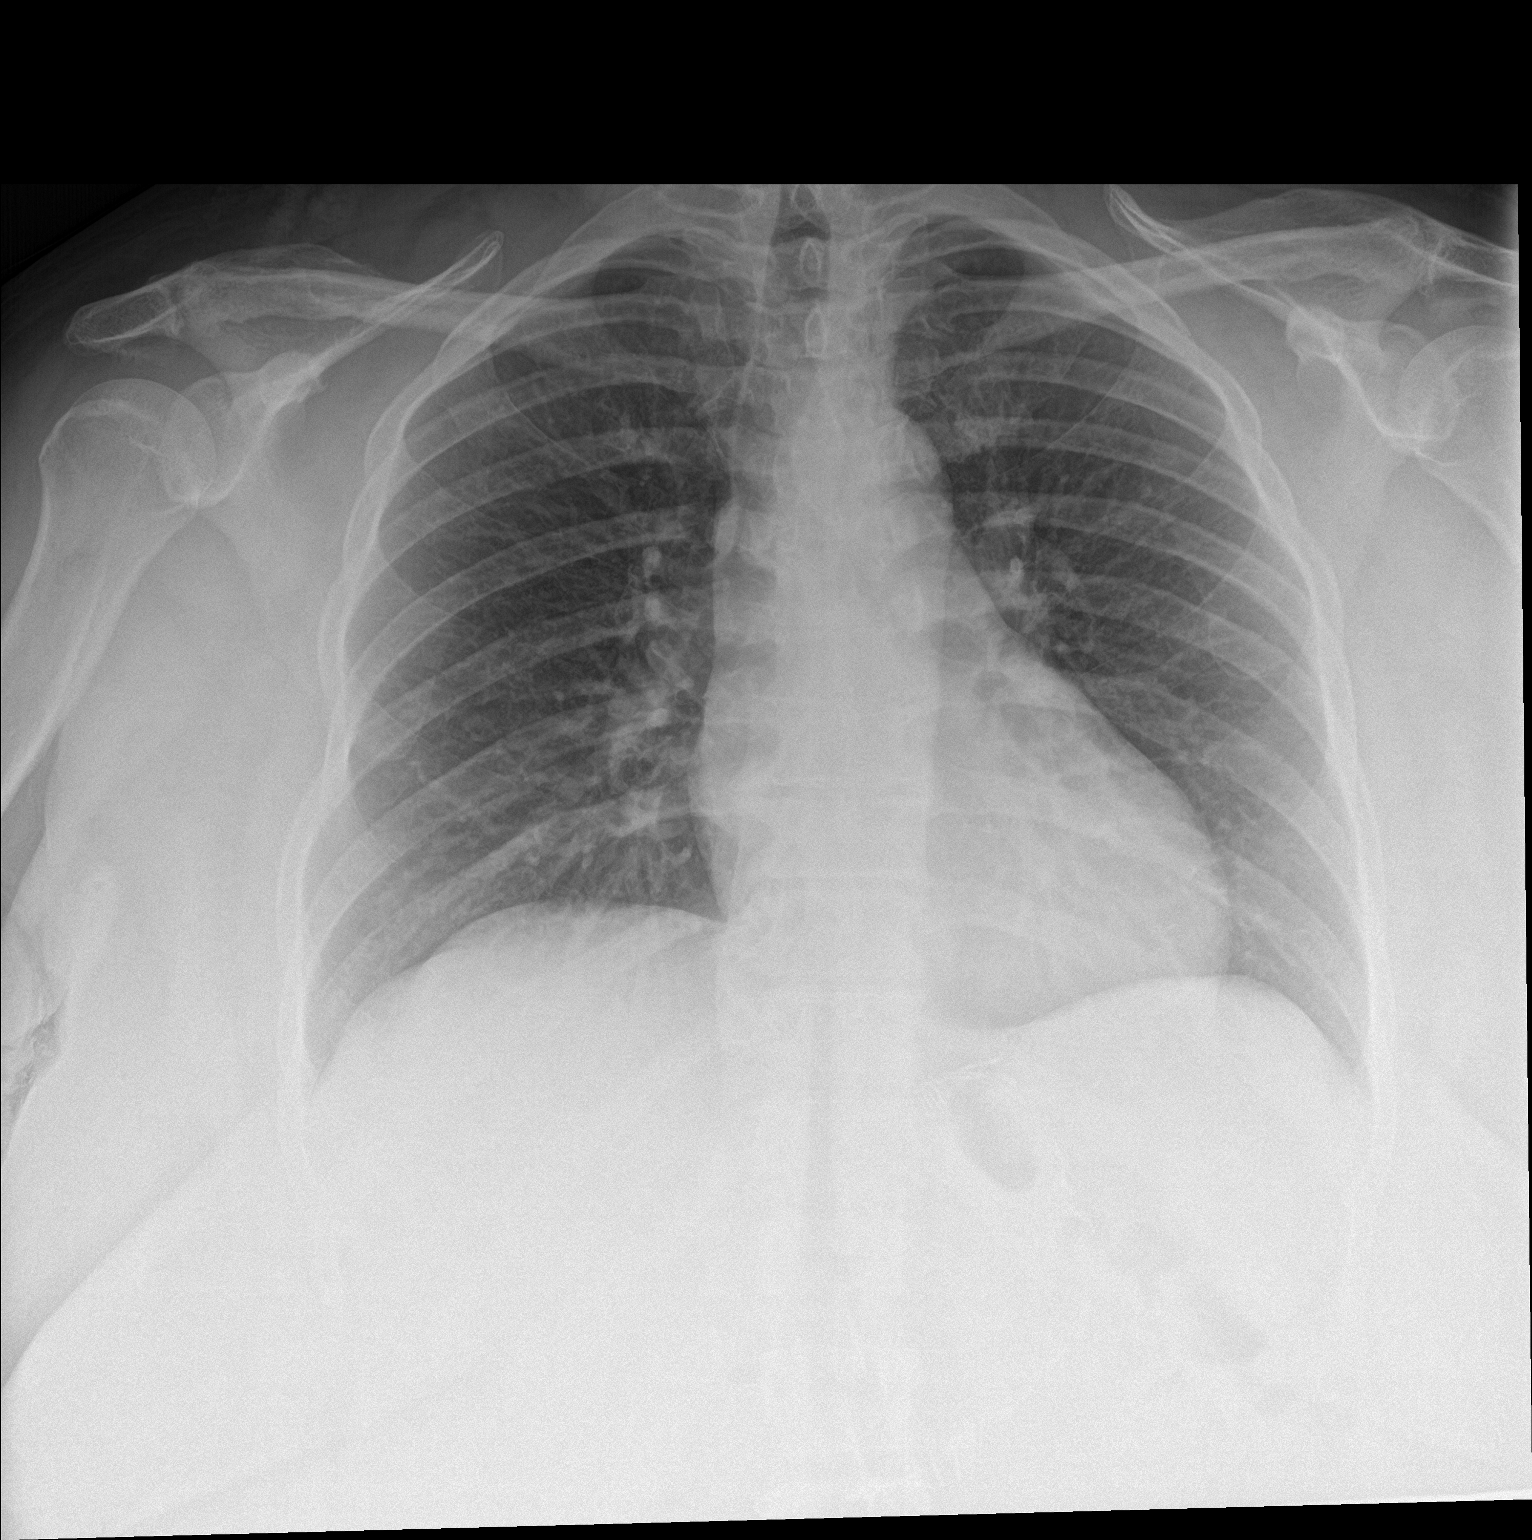

[chest lat]
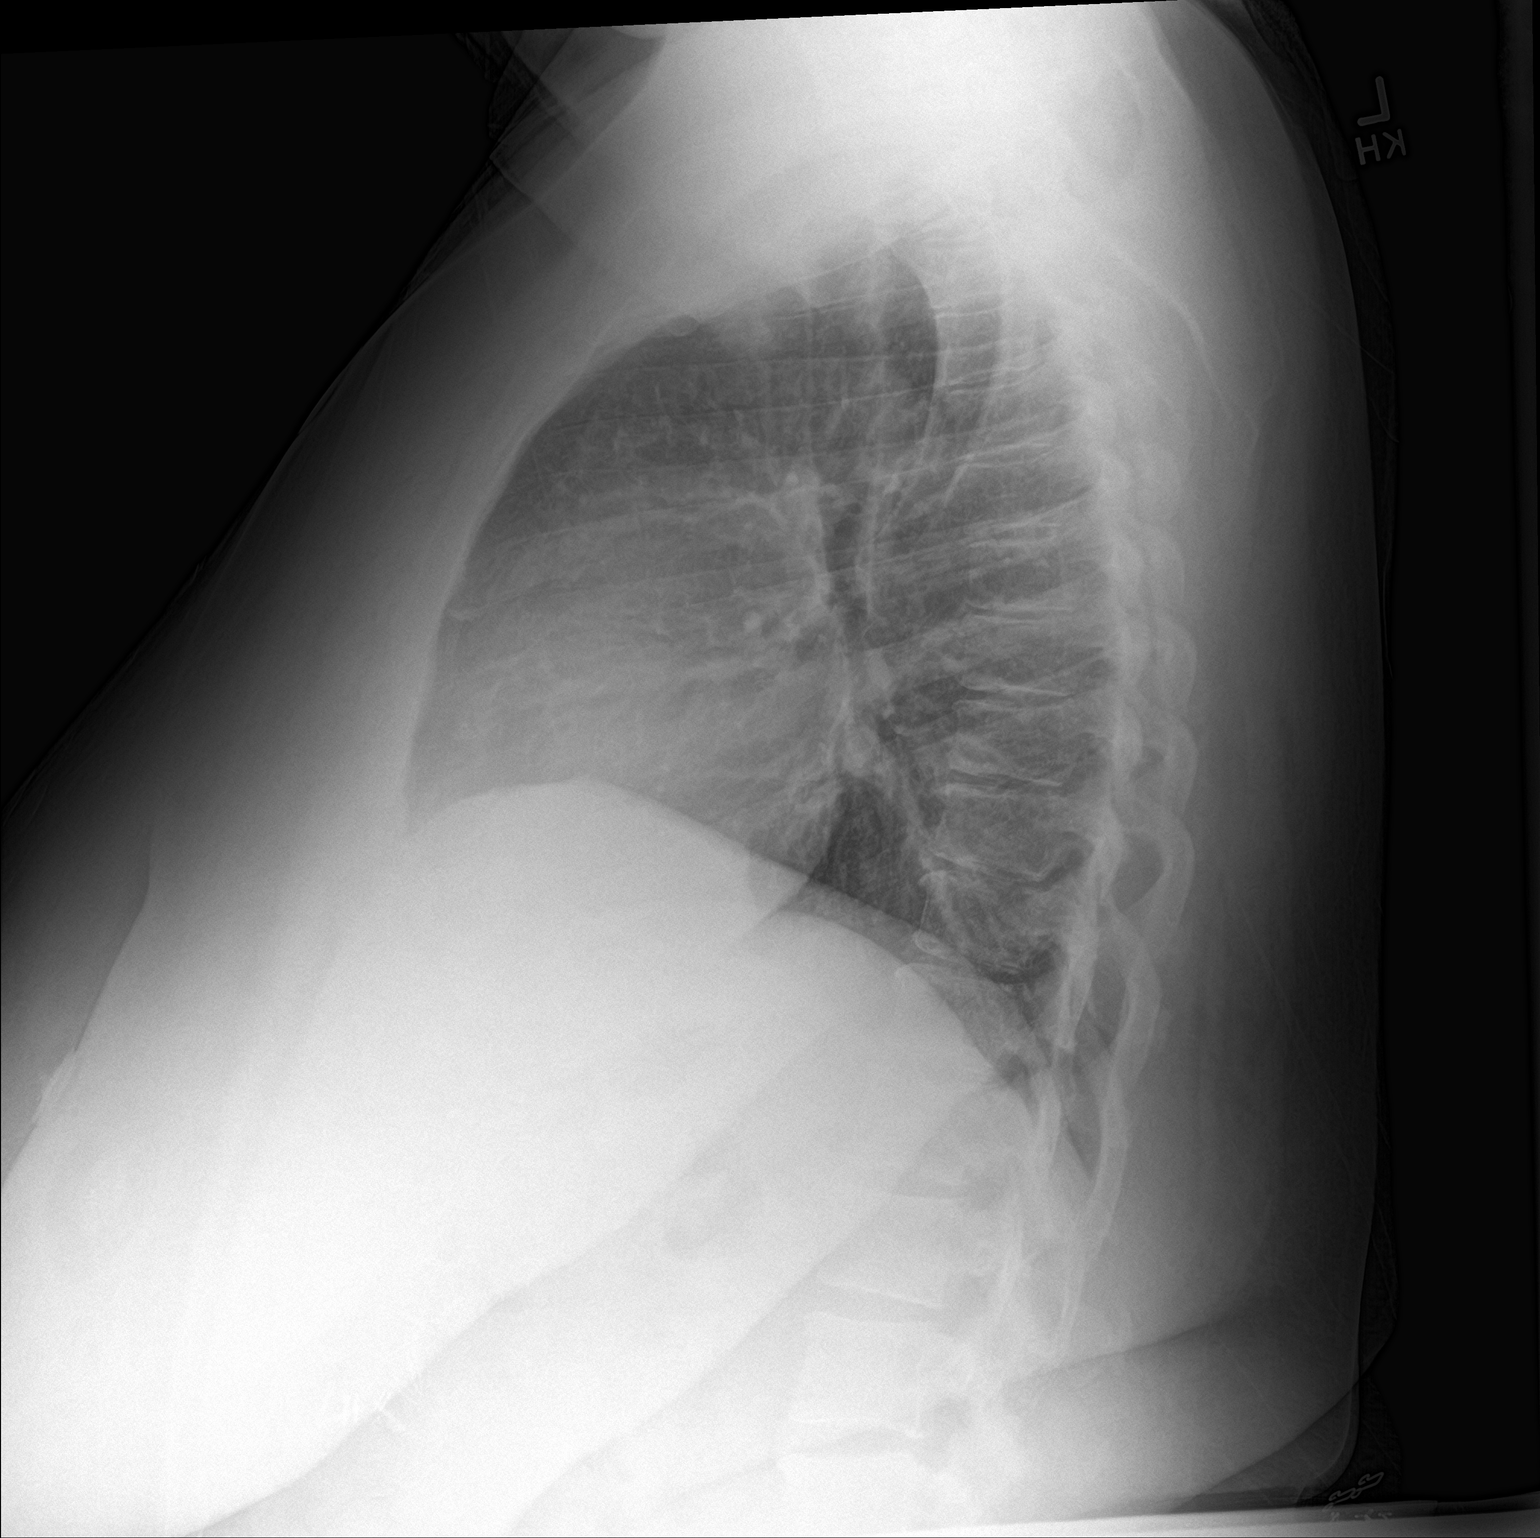

[2 of 2 positions shown; findings below may reference images not displayed]

FINDINGS: The heart size and mediastinal contours are within normal limits.
Both lungs are clear. The visualized skeletal structures are
unremarkable.
IMPRESSION: No active cardiopulmonary disease.

## 2018-02-18 DEATH — deceased
